# Patient Record
Sex: Female | Born: 1985 | Race: White | Hispanic: No | Marital: Single
Health system: Northeastern US, Academic
[De-identification: ages and names within clinical notes are randomized; demographics above are authoritative.]

---

## 2014-07-30 ENCOUNTER — Ambulatory Visit

## 2016-10-11 ENCOUNTER — Ambulatory Visit: Admitting: Family Medicine

## 2016-10-11 ENCOUNTER — Ambulatory Visit (HOSPITAL_BASED_OUTPATIENT_CLINIC_OR_DEPARTMENT_OTHER)

## 2016-10-11 NOTE — Progress Notes (Signed)
* * *        **[name], [name] [name]**    --- ---    55 Y old Female, DOB: 08-08-1985    Account Number: [phone] [id number]    [address], [place], [id number]    Home: [phone]    Guarantor: Ceasar Lund Insurance: [employer] Payer ID: [id number]    Appointment Facility: [facility] [place]        * * *    10/11/2016  Progress Notes: Gaye Alken    --- ---    ---         **Current Medications**    ---    Taking     * Sertraline HCl 100 MG Tablet TK 1 AND 1/2 T PO QD Oral     ---    * Methylphenidate HCl 10 MG Tablet (Schedule II Drug) Oral     ---    * Methylphenidate HCl ER 20 MG Tablet Extended Release (Schedule II Drug) TK 1 T PO ONCE D Oral     ---      Past Medical History    ---       Depression.        ---    ADD.        ---    ADHD.        ---       **Surgical History**    ---       Denies Past Surgical History    ---       **Family History**    ---       Father: alive, leukemia, diagnosed with Heart Disease    ---    Mother: alive    ---    1 brother(s) , 2 sister(s) - healthy.    ---      **Social History**    ---     _Tobacco Use:_    Tobacco Use/Smoking Are you a former smoker.    _Quality Folder 2017:_    Previsit Prep discussed with care team/Quality Folder Updated Last updated on:  10/11/2016.    _Family, Social and Culture:_    Alcohol Screening Did you have a drink containing alcohol in the past year?  Yes, How often did you have a drink containing alcohol in the past year?  Monthly or less (1 point), How many drinks did you have on a typucal day when  you were drinking in the past year? 1 or 2 (0 points), How often did you have  six or more drinks on one occasion in the past year? Never (0 points), Points  1, Interpretation Negative. Communication Limitations: None. Education \-->  finished college. Exercise Frequency: Occasionally. Marital Status \-->  single. Nutrition Diet no special diet. Occupation Employment Status:  Employed.    _Behavioral Health:_     Family History Family History Of Substance Abuse: No, Family History of Mental  Health: No. GAD-2  \- Feeling nervous, anxious, or on edge: 0 = Not at all, -  Not being able to stop or control worrying: 0 --> Not at all.    _Sexual History:_    Sexual History Had sex in the past 12 months (vaginal, oral, or anal)? Yes,  with Men only.      **Allergies**    ---       N.K.D.A.    ---       **Hospitalization/Major Diagnostic Procedure**    ---       Denies Past  Hospitalization    ---       **Review of Systems**    ---     _ANNUAL_ROS_ :    CONST: NEGATIVE FOR:, Fevers, Fatigue. EYES: NEGATIVE FOR: , Vision changes,  Eye Pain. CV: NEGATIVE FOR:, Chest Pain, DOE. RESPIRATORY: NEGATIVE FOR: ,  Cough, Excessive snoring. GI: NEGATIVE FOR: , abdominal pain , nausea. MSK:  NEGATIVE FOR: , Myalgias, Back pain.            **Reason for Appointment**    ---       1\. Medication,needs new pcp    ---       **History of Present Illness**    ---     _Asthma_ :    _Depression Screening_ :    PHQ 9 Little interest or pleasure in doing things More than half the days,  Feeling down, depressed, or hopeless More than half the days, Trouble falling  or staying asleep, or sleeping too much Several days, Feeling tired or having  little energy Not at all, Poor appetite or overeating Several days, Feeling  bad about yourself, or that you are a failure, or have let yourself or your  family down Not at all, Trouble concentrating on things, such as reading the  newspaper or watching television Not at all, Moving or speaking so slowly that  other people could have noticed. Or the opposite ? being so fidgety or  restless that you have been moving around a lot more than usual Not at all,  Thoughts that you would be better off dead, or of hurting yourself in some way  Not at all, Total Score 6, Interpretation Mild Depression. PHQ-2 (2015  Edition) Little interest or pleasure in doing things? Several days, Feeling  down, depressed, or hopeless?  Several days, Total Score 2\.    _-HPI-_ :    Pt is here for establishment. Pt dx'd as ADD 1 and 1/2years ago and on  adderall. Pt is on short acting as needed and extended release on regular  basis. pt denied any side effect.       **Vital Signs**    ---    BP 108/70 mm Hg, Pulse 92, Temp 98.5, Oxygen sat % 98, Wt 159, BMI 27.29, Ht  64.       **Examination**    ---     _Exam (R.G.)_ :    GENERAL APPEARANCE: no acute distress.    CARDIOVASCULAR: regular rate and rhythm, no murmur.    RESPIRATORY: clear to auscultation.    GASTROINTESTINAL: abdomen soft non-tender.    PSYCH: Good eye contact, appropriate affect, appropriate mood, alert.          **Assessments**    ---    1\. Attention deficit disorder, unspecified hyperactivity presence - F98.8  (Primary)    ---    2\. Depression, unspecified depression type - F32.9    ---      Mass PAT reviewed. Pt understood I am only offering temporary supply. Psych  info given. F/U Annual physical when needed. PAP done in [place] this year.    ---       **Treatment**    ---       **1\. Attention deficit disorder, unspecified hyperactivity presence**    Referral To:[facility]    Reason:ADD        ---         **2\. Others**    Refill Methylphenidate HCl Tablet, 10 MG, 1 tablet as needed, Oral, Once  a  day, 30, 30, Refills 0    Refill Methylphenidate HCl ER Tablet Extended Release, 20 MG, 1 tablet, Oral,  Once a day, 30, 30 Unspecified, Refills 0       **Follow Up**    ---    Reason: Annual    Electronically signed by Gaye Alken , MD on 10/11/2016 at 04:33 PM EDT    Sign off status: Completed        * * [facility] [place]    [address]    [place], [place] [address]    Tel: [phone]    Fax: [phone]              * * *          Patient: [name] [name], [name] DOB: 1985-09-04 Progress Note: Gaye Alken  10/11/2016    ---    Note generated by eClinicalWorks EMR/PM Software ([email or web address])

## 2016-10-17 ENCOUNTER — Ambulatory Visit

## 2017-06-28 ENCOUNTER — Other Ambulatory Visit: Payer: Self-pay

## 2017-06-28 ENCOUNTER — Emergency Department
Admission: EM | Admit: 2017-06-28 | Discharge: 2017-06-28 | Disposition: A | Discharge status: Home / Self Care | Payer: BLUE CROSS/BLUE SHIELD | Attending: Emergency Medicine | Admitting: Emergency Medicine

## 2017-06-28 ENCOUNTER — Emergency Department: Payer: BLUE CROSS/BLUE SHIELD

## 2017-06-28 DIAGNOSIS — R102 Pelvic and perineal pain: Secondary | ICD-10-CM | POA: Diagnosis not present

## 2017-06-28 DIAGNOSIS — R1030 Lower abdominal pain, unspecified: Secondary | ICD-10-CM

## 2017-06-28 DIAGNOSIS — N760 Acute vaginitis: Secondary | ICD-10-CM | POA: Insufficient documentation

## 2017-06-28 DIAGNOSIS — B9689 Other specified bacterial agents as the cause of diseases classified elsewhere: Secondary | ICD-10-CM | POA: Insufficient documentation

## 2017-06-28 LAB — URINALYSIS, COMPLETE (UACMP) WITH MICROSCOPIC
BACTERIA UA: NONE SEEN
Bilirubin Urine: NEGATIVE
Glucose, UA: NEGATIVE mg/dL
Ketones, ur: 5 mg/dL — AB
Nitrite: NEGATIVE
Protein, ur: NEGATIVE mg/dL
SPECIFIC GRAVITY, URINE: 1.015 (ref 1.005–1.030)
pH: 7 (ref 5.0–8.0)

## 2017-06-28 LAB — COMPREHENSIVE METABOLIC PANEL
ALBUMIN: 4.8 g/dL (ref 3.5–5.0)
ALT: 20 U/L (ref 14–54)
AST: 30 U/L (ref 15–41)
Alkaline Phosphatase: 58 U/L (ref 38–126)
Anion gap: 9 (ref 5–15)
BILIRUBIN TOTAL: 0.9 mg/dL (ref 0.3–1.2)
BUN: 12 mg/dL (ref 6–20)
CHLORIDE: 103 mmol/L (ref 101–111)
CO2: 24 mmol/L (ref 22–32)
Calcium: 9.3 mg/dL (ref 8.9–10.3)
Creatinine, Ser: 0.88 mg/dL (ref 0.44–1.00)
GFR calc Af Amer: >60 mL/min (ref >60)
GFR calc non Af Amer: >60 mL/min (ref >60)
GLUCOSE: 117 mg/dL — AB (ref 65–99)
POTASSIUM: 3.6 mmol/L (ref 3.5–5.1)
Sodium: 136 mmol/L (ref 135–145)
Total Protein: 8.2 g/dL — ABNORMAL HIGH (ref 6.5–8.1)

## 2017-06-28 LAB — CBC
HEMATOCRIT: 39.5 % (ref 35.0–47.0)
Hemoglobin: 13.5 g/dL (ref 12.0–16.0)
MCH: 30.6 pg (ref 26.0–34.0)
MCHC: 34.3 g/dL (ref 32.0–36.0)
MCV: 89.2 fL (ref 80.0–100.0)
Platelets: 260 10*3/uL (ref 150–440)
RBC: 4.43 MIL/uL (ref 3.80–5.20)
RDW: 13.4 % (ref 11.5–14.5)
WBC: 8 10*3/uL (ref 3.6–11.0)

## 2017-06-28 LAB — WET PREP, GENITAL
Sperm: NONE SEEN
Trich, Wet Prep: NONE SEEN
YEAST WET PREP: NONE SEEN

## 2017-06-28 LAB — POCT PREGNANCY, URINE: Preg Test, Ur: NEGATIVE

## 2017-06-28 LAB — CHLAMYDIA/NGC RT PCR (ARMC ONLY)
Chlamydia Tr: NOT DETECTED
N gonorrhoeae: NOT DETECTED

## 2017-06-28 LAB — LIPASE, BLOOD: Lipase: 26 U/L (ref 11–51)

## 2017-06-28 MED ORDER — METRONIDAZOLE 500 MG PO TABS
500.0000 mg | ORAL_TABLET | Freq: Once | ORAL | Status: AC
  Administered 2017-06-28: 500 mg via ORAL
  Filled 2017-06-28: qty 1

## 2017-06-28 MED ORDER — METRONIDAZOLE 500 MG PO TABS: 500.0000 mg | ORAL_TABLET | Freq: Two times a day (BID) | ORAL | 0 refills | Status: AC

## 2017-06-28 NOTE — ED Triage Notes (Signed)
Pt c/o abd pain with stomach cramps started 1 hour ago -c/o nausea - denies vomiting or diarrhea

## 2017-06-28 NOTE — ED Provider Notes (Signed)
[facility] Emergency Department Provider Note  ___________________________________________   First MD Initiated Contact with Patient 06/28/17 1400     (approximate)  I have reviewed the triage vital signs and the nursing notes.   HISTORY  Chief Complaint Abdominal Pain   HPI [name] is a 32 y.o. female without any chronic medical conditions who is presenting with sudden onset lower abdominal pain and cramping for about 2 hours prior to arrival.  Says that the pain has reduced and is almost gone at this point.  She is denying any burning with urination.  Said that when the pain was at maximum it was a 10 out of 10 without radiation.  Was to the suprapubic region.  Denies any history of kidney stones or ovarian cysts or fibroids.  Denies any vaginal bleeding or discharge.  History reviewed. No pertinent past medical history.  There are no active problems to display for this patient.   History reviewed. No pertinent surgical history.  Prior to Admission medications   Not on File    Allergies Patient has no known allergies.  No family history on file.  Social History Social History   Tobacco Use  . Smoking status: Never Smoker  . Smokeless tobacco: Never Used  Substance Use Topics  . Alcohol use: Yes    Alcohol/week: 6.0 oz    Types: 10 Cans of beer per week    Comment: every week  . Drug use: Never    Review of Systems  Constitutional: No fever/chills Eyes: No visual changes. ENT: No sore throat. Cardiovascular: Denies chest pain. Respiratory: Denies shortness of breath. Gastrointestinal: no vomiting.  No diarrhea.  No constipation. Genitourinary: Negative for dysuria. Musculoskeletal: Negative for back pain. Skin: Negative for rash. Neurological: Negative for headaches, focal weakness or numbness.   ____________________________________________   PHYSICAL EXAM:  VITAL SIGNS: ED Triage Vitals  Enc Vitals Group     BP 06/28/17 1133 120/77     Pulse Rate 06/28/17 1133 65     Resp 06/28/17 1133 15     Temp 06/28/17 1133 (!) 97.5 F (36.4 C)     Temp Source 06/28/17 1133 Oral     SpO2 06/28/17 1133 100 %     Weight 06/28/17 1131 160 lb (72.6 kg)     Height 06/28/17 1131 5\' 2"  (1.575 m)     Head Circumference --      Peak Flow --      Pain Score 06/28/17 1130 10     Pain Loc --      Pain Edu? --      Excl. in GC? --     Constitutional: Alert and oriented. Well appearing and in no acute distress. Eyes: Conjunctivae are normal.  Head: Atraumatic. Nose: No congestion/rhinnorhea. Mouth/Throat: Mucous membranes are moist.  Neck: No stridor.   Cardiovascular: Normal rate, regular rhythm. Grossly normal heart sounds.  Good peripheral circulation. Respiratory: Normal respiratory effort.  No retractions. Lungs CTAB. Gastrointestinal: Soft and nontender. No distention. No CVA tenderness. Genitourinary: Normal external exam without any lesions.  Speculum exam with a small amount of blood in cervical mucus at the cervical loss.  Bimanual exam without CMT.  No uterine tenderness nor masses.  No adnexal tenderness nor masses. Musculoskeletal: No lower extremity tenderness nor edema.  No joint effusions. Neurologic:  Normal speech and language. No gross focal neurologic deficits are appreciated. Skin:  Skin is warm, dry and intact. No rash noted. Psychiatric: Mood and affect are  normal. Speech and behavior are normal.  ____________________________________________   LABS (all labs ordered are listed, but only abnormal results are displayed)  Labs Reviewed  COMPREHENSIVE METABOLIC PANEL - Abnormal; Notable for the following components:      Result Value   Glucose, Bld 117 (*)    Total Protein 8.2 (*)    All other components within normal limits  URINALYSIS, COMPLETE (UACMP) WITH MICROSCOPIC - Abnormal; Notable for the following components:   Color, Urine YELLOW (*)    APPearance CLOUDY (*)    Hgb  urine dipstick MODERATE (*)    Ketones, ur 5 (*)    Leukocytes, UA MODERATE (*)    All other components within normal limits  WET PREP, GENITAL  CHLAMYDIA/NGC RT PCR ([facility] ONLY)  LIPASE, BLOOD  CBC  POC URINE PREG, ED  POCT PREGNANCY, URINE   ____________________________________________  EKG   ____________________________________________  RADIOLOGY  Normal exam without evidence of torsion.  No masses to the bilateral ovaries. ____________________________________________   PROCEDURES  Procedure(s) performed:   Procedures  Critical Care performed:   ____________________________________________   INITIAL IMPRESSION / ASSESSMENT AND PLAN / ED COURSE  Pertinent labs & imaging results that were available during my care of the patient were reviewed by me and considered in my medical decision making (see chart for details).  Differential diagnosis includes, but is not limited to, ovarian cyst, ovarian torsion, acute appendicitis, diverticulitis, urinary tract infection/pyelonephritis, endometriosis, bowel obstruction, colitis, renal colic, gastroenteritis, hernia, fibroids, endometriosis, pregnancy related pain including ectopic pregnancy, etc. As part of my medical decision making, I reviewed the following data within the electronic MEDICAL RECORD NUMBER Notes from prior ED visits  ----------------------------------------- 5:14 PM on 06/28/2017 -----------------------------------------  Patient at this time is pain-free.  I reexamined her abdomen she is soft and nontender throughout.  Positive for clue cells and will be treated for bacterial vaginosis.  Very reassuring ultrasound of the pelvis.  Unlikely to be ovarian torsion.  Unclear cause of the patient's pain.  Possible BV but severe abdominal pain over 2 hours with complete resolution would be unusual for this diagnosis.  Possible muscle spasm versus kidney stone versus gas pain.  Patient knows to return to the  emergency room for any further worsening or concerning symptoms.  She is understanding of the treatment plan as well as the diagnosis and willing to comply. ____________________________________________   FINAL CLINICAL IMPRESSION(S) / ED DIAGNOSES  Final diagnoses:  Pelvic pain  Pelvic pain   Bacterial vaginosis.  Lower abdominal pain.   NEW MEDICATIONS STARTED DURING THIS VISIT:  New Prescriptions   No medications on file     Note:  This document was prepared using Dragon voice recognition software and may include unintentional dictation errors.     Myrna Blazer, MD 06/28/17 [phone]

## 2017-06-28 NOTE — ED Notes (Signed)
Pt ambulatory to POV without difficulty with mother. VSS. NAD. Discharge instructions and follow up discussed. All questions addressed.

## 2017-06-30 LAB — URINE CULTURE

## 2018-08-30 IMAGING — US US ART/VEN ABD/PELV/SCROTUM DOPPLER LTD
1 series · 14 of 25 positions shown · non-contrast
Comparison: None.

CLINICAL DATA: 2 hours.  Pelvic pain and cramping for

EXAM:
TRANSABDOMINAL AND TRANSVAGINAL ULTRASOUND OF PELVIS
DOPPLER ULTRASOUND OF OVARIES
TECHNIQUE: Both transabdominal and transvaginal ultrasound examinations of the
pelvis were performed. Transabdominal technique was performed for
global imaging of the pelvis including uterus, ovaries, adnexal
regions, and pelvic cul-de-sac.
It was necessary to proceed with endovaginal exam following the
transabdominal exam to visualize the endometrium. Color and duplex
Doppler ultrasound was utilized to evaluate blood flow to the
ovaries.

[Series 1: us art/ven abd/pelv/scrotum doppler ltd · 0.21mm/px · 14 of 139 slices shown]
[im 1/139]
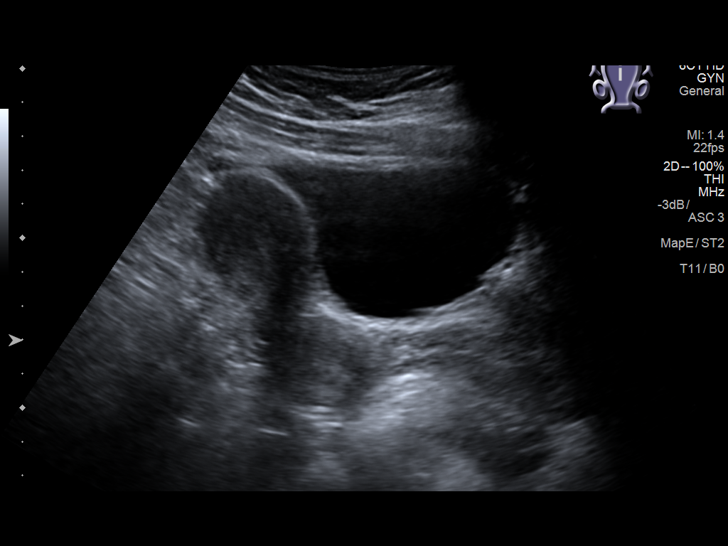
[im 12/139]
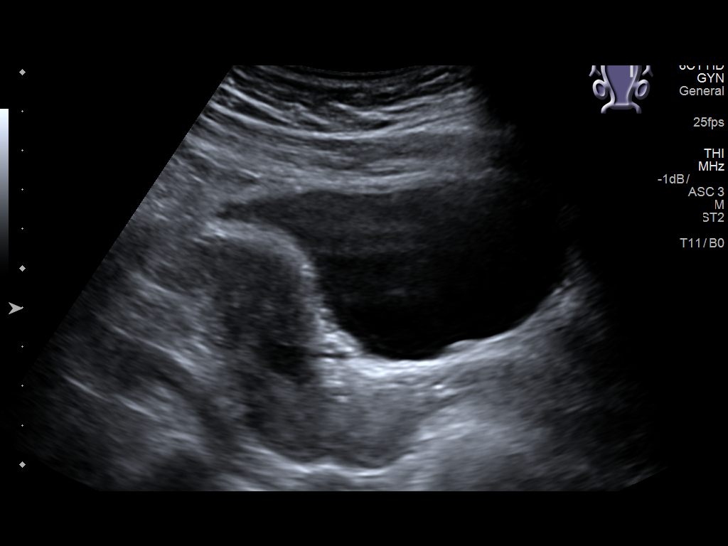
[im 24/139]
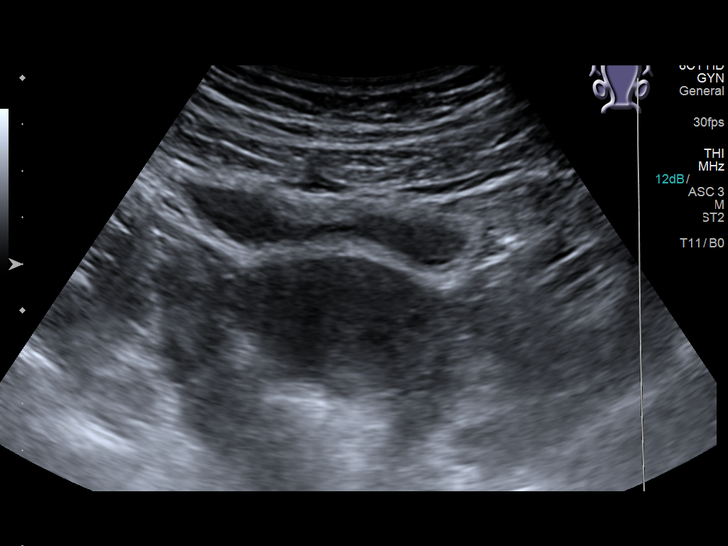
[im 35/139]
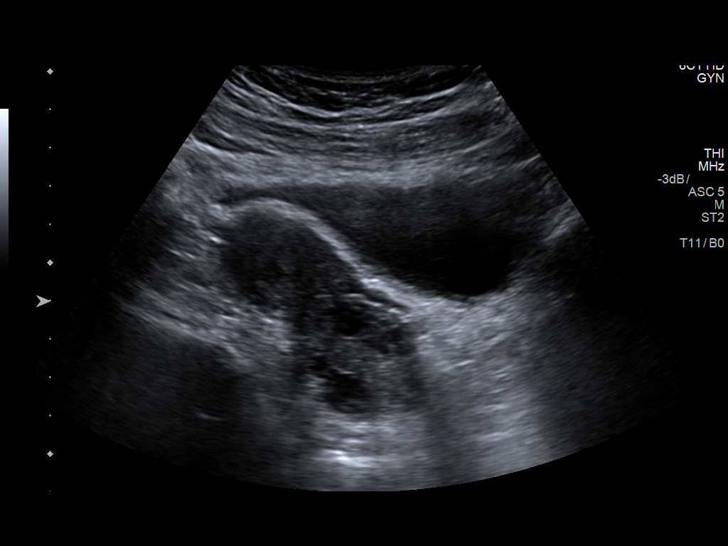
[im 47/139]
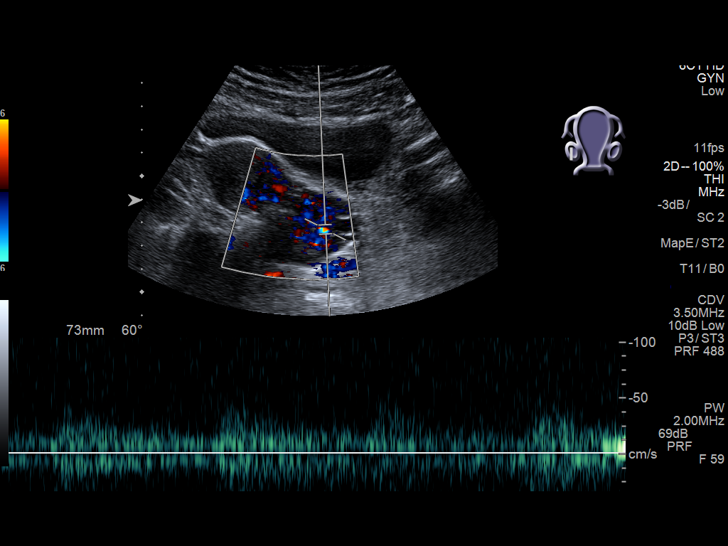
[im 52/139]
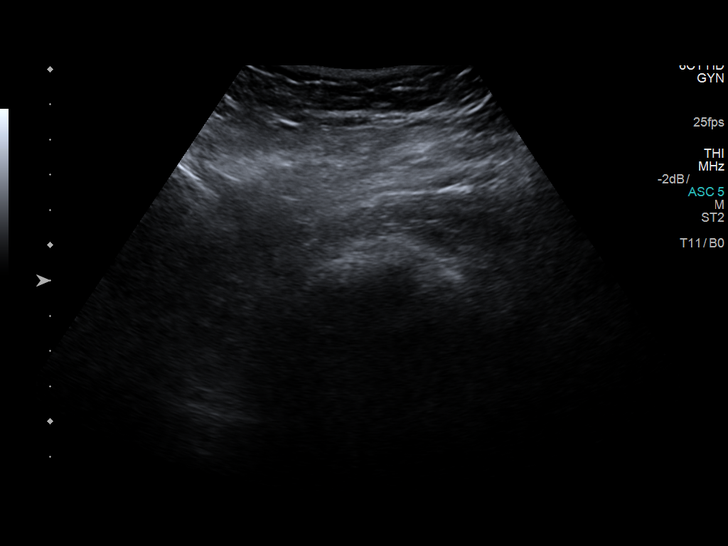
[im 64/139]
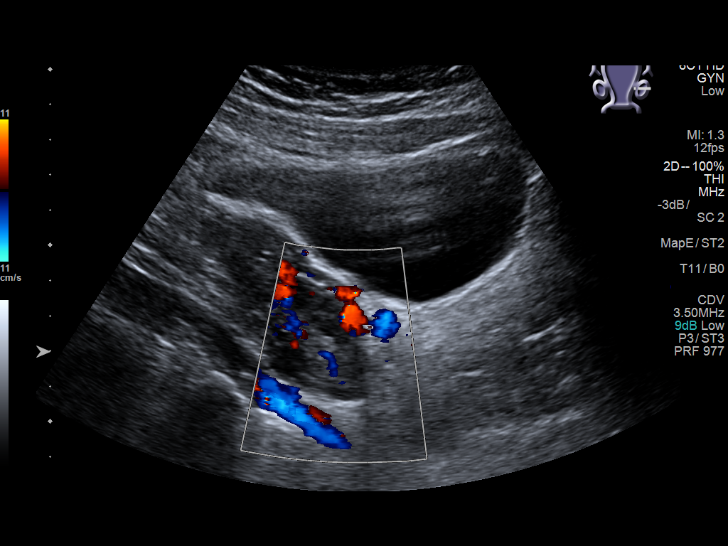
[im 75/139]
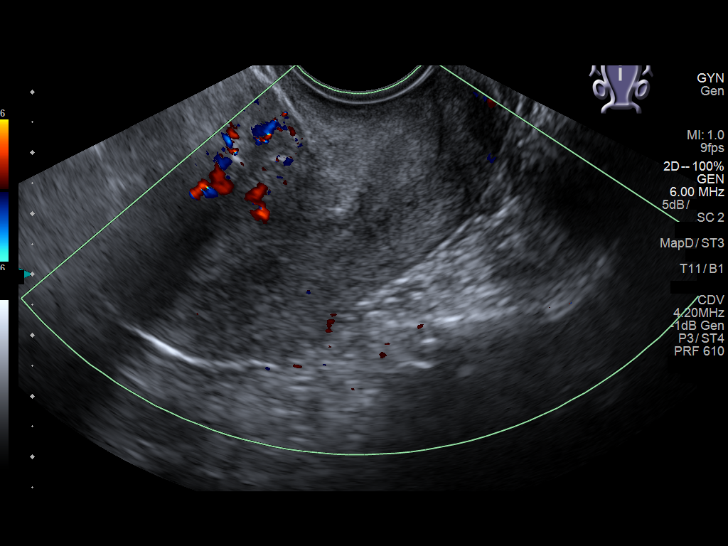
[im 87/139]
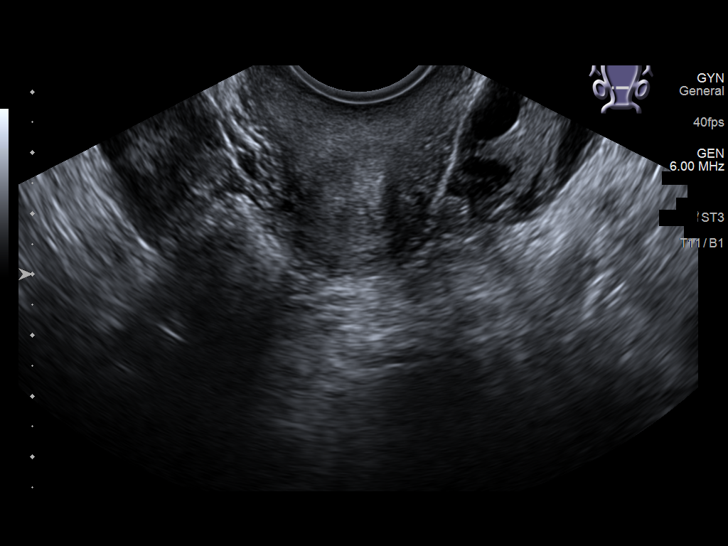
[im 93/139]
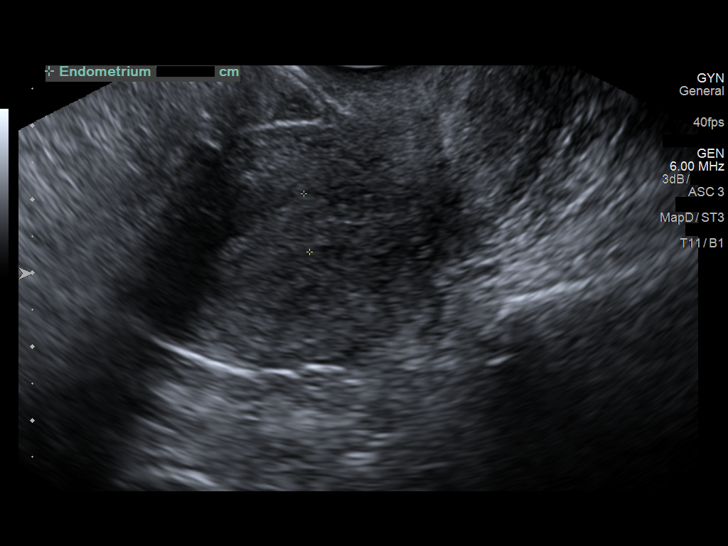
[im 104/139]
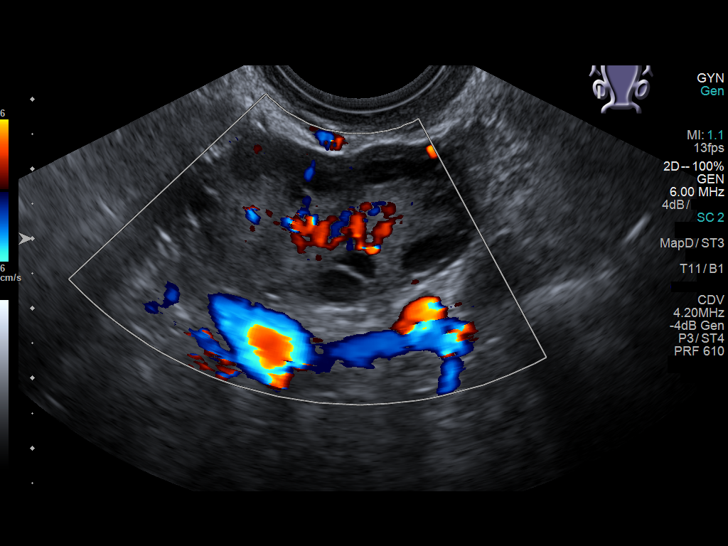
[im 116/139]
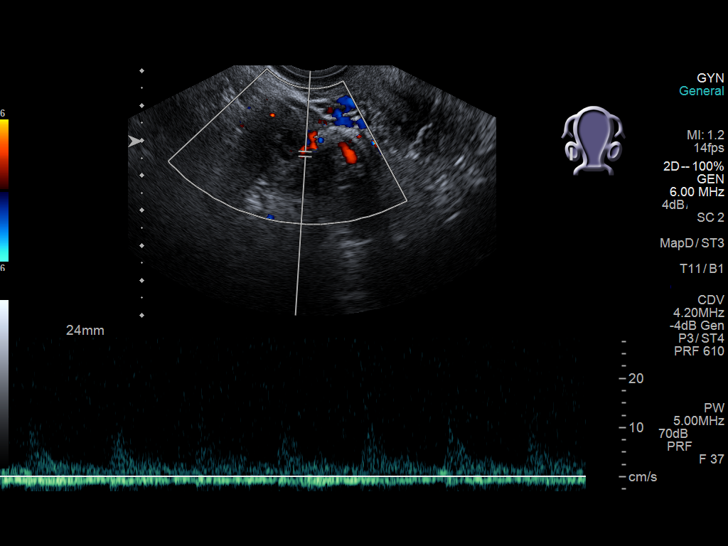
[im 127/139]
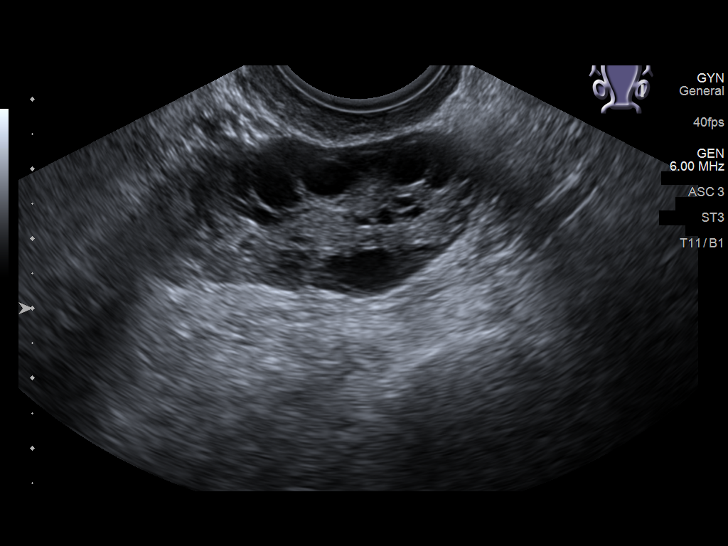
[im 139/139]
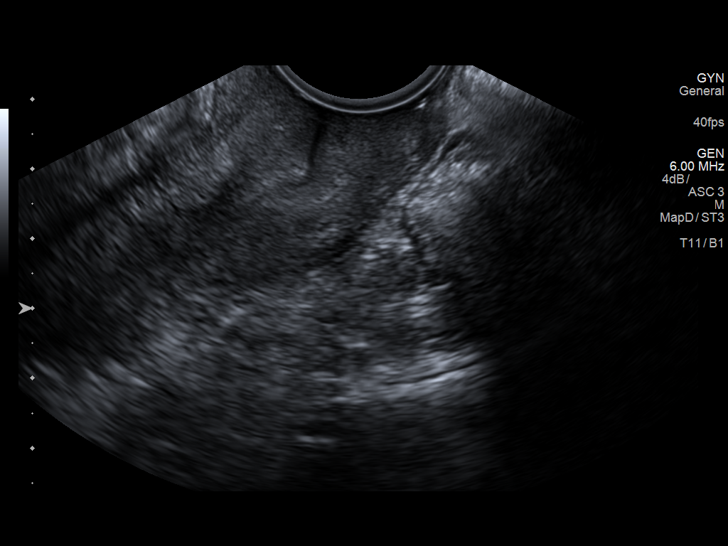

[14 of 25 positions shown; findings below may reference images not displayed]

FINDINGS: Uterus

Measurements: 7.5 x 3.2 x 4.1 cm. No fibroids or other mass
visualized.

Endometrium

Thickness: 0.8 cm.  No focal abnormality visualized.

Right ovary

Measurements: 4.4 x 2.2 x 3.2 cm. Normal appearance/no adnexal mass.

Left ovary

Measurements: 4.8 x 2.2 x 3.1 cm. Normal appearance/no adnexal mass.

Pulsed Doppler evaluation of both ovaries demonstrates normal
low-resistance arterial and venous waveforms.

Other findings

No abnormal free fluid.
IMPRESSION: Normal examination.  Negative for ovarian torsion.
